# Patient Record
Sex: Female | Born: 1975 | Hispanic: Yes | Marital: Single | Smoking: Never smoker
Health system: Southern US, Community
[De-identification: ages and names within clinical notes are randomized; demographics above are authoritative.]

---

## 2003-05-23 ENCOUNTER — Encounter: Admission: RE | Admit: 2003-05-23 | Discharge: 2003-05-23 | Payer: Self-pay | Admitting: Specialist

## 2005-03-26 ENCOUNTER — Ambulatory Visit: Payer: Self-pay | Admitting: Obstetrics and Gynecology

## 2005-04-02 ENCOUNTER — Ambulatory Visit: Payer: Self-pay | Admitting: Obstetrics and Gynecology

## 2008-04-04 ENCOUNTER — Ambulatory Visit: Payer: Self-pay | Admitting: Ophthalmology

## 2010-12-23 ENCOUNTER — Ambulatory Visit: Payer: Self-pay | Admitting: Otolaryngology

## 2011-07-28 ENCOUNTER — Ambulatory Visit: Payer: Self-pay | Admitting: Internal Medicine

## 2014-10-18 ENCOUNTER — Ambulatory Visit (INDEPENDENT_AMBULATORY_CARE_PROVIDER_SITE_OTHER): Payer: BLUE CROSS/BLUE SHIELD

## 2014-10-18 ENCOUNTER — Ambulatory Visit (INDEPENDENT_AMBULATORY_CARE_PROVIDER_SITE_OTHER): Payer: BLUE CROSS/BLUE SHIELD | Admitting: Podiatry

## 2014-10-18 ENCOUNTER — Encounter: Payer: Self-pay | Admitting: Podiatry

## 2014-10-18 VITALS — BP 128/81 | HR 79 | Resp 16

## 2014-10-18 DIAGNOSIS — M722 Plantar fascial fibromatosis: Secondary | ICD-10-CM | POA: Diagnosis not present

## 2014-10-18 MED ORDER — METHYLPREDNISOLONE 4 MG PO TBPK: ORAL_TABLET | ORAL | Status: DC

## 2014-10-18 NOTE — Progress Notes (Signed)
   Subjective:    Patient ID: Jade GeorgeAbigail Guiterrez David, female    DOB: 1975/08/18, 39 y.o.   MRN: [id number]  HPI Comments: "I have pain in my foot"  Patient c/o aching plantar/lateral heel right for about 3 months. AM pain. She had this same problem in October 2015 and went to doctor and was given Naproxen and a compression ankle brace. Meds help some. She stands on feet all day and by end of day its very painful.  Patient presents with an interpreter.  Foot Pain      Review of Systems  All other systems reviewed and are negative.      Objective:   Physical Exam  Musculoskeletal:       Feet:   objective: I have reviewed her past mental history medications allergies surgery social history and review of systems. Pulses are strongly palpable bilateral. Neurologic sensorium is intact persons once the monofilament. Deep tendon reflexes are intact bilaterally and muscle strength is 5 over 5 dorsiflexion and plantar flexors and inverters and everters all intrinsic musculature is intact. Orthopedic evaluation demonstrates all joints distal to the ankle before range of motion without crepitation. She has pain on palpation the lateral calcaneal tubercle of the right heel.        Assessment & Plan:  Assessment: Plantar fasciitis right foot lateral tubercle.  Plan: Started her on a Medrol Dosepak to be followed by her naproxen. Injected her right lateral heel today placed her in a plantar fascial brace and the night splint. Discussed appropriate shoe gear stretching exercises ice therapy and sugar modifications.

## 2014-10-18 NOTE — Patient Instructions (Signed)

## 2014-11-15 ENCOUNTER — Ambulatory Visit (INDEPENDENT_AMBULATORY_CARE_PROVIDER_SITE_OTHER): Payer: BLUE CROSS/BLUE SHIELD | Admitting: Podiatry

## 2014-11-15 DIAGNOSIS — M722 Plantar fascial fibromatosis: Secondary | ICD-10-CM

## 2014-11-15 MED ORDER — MELOXICAM 15 MG PO TABS: 15.0000 mg | ORAL_TABLET | Freq: Every day | ORAL | Status: DC

## 2014-11-15 NOTE — Progress Notes (Signed)
Jade David presents with an interpreter today for follow-up of her plantar fasciitis to the lateral aspect of her right foot. She states that is approximately 60% improved. She continues to wear the day brace and the night splint. She currently is not taking any anti-inflammatory's having finished naproxen the urgent care physician provided her. She is currently wearing good tennis shoes and states that that has helped.  Objective: Vital signs are stable she is alert and oriented 3. Pulses are palpable. She has no pain on palpation medial calcaneal tubercle of the right heel. She only has pain on palpation to the lateral calcaneal tubercle of the right heel and it is pinpoint. No plantar tubercle pain. No erythema edema saline as drainage or odor. No pain on medial and lateral compression of the calcaneus.  Assessment: Lateral band plantar fasciitis right heel. 60% resolved.  Plan: Discussed etiology pathology conservative versus surgical therapies. At this point I am requesting that she continue a non-steroidal and a daily basis so we will prescribe meloxicam. I also injected her right heel today with Kenalog and local and aesthetic to the lateral aspect at the point of maximal tenderness. She will continue all other conservative therapies and I will follow-up with her in 4-6 weeks.

## 2014-12-20 ENCOUNTER — Encounter: Payer: Self-pay | Admitting: Podiatry

## 2014-12-20 ENCOUNTER — Ambulatory Visit (INDEPENDENT_AMBULATORY_CARE_PROVIDER_SITE_OTHER): Payer: Managed Care, Other (non HMO) | Admitting: Podiatry

## 2014-12-20 VITALS — BP 126/74 | HR 81 | Resp 16

## 2014-12-20 DIAGNOSIS — M722 Plantar fascial fibromatosis: Secondary | ICD-10-CM | POA: Diagnosis not present

## 2014-12-20 NOTE — Progress Notes (Signed)
She presents today for follow-up of her plantar fasciitis. She presents with her interpreter today however she is not utilized. She states that she is doing much better and has very little pain. She states that she notices when she does not wear the interfascial brace her pain is worse. She continues oral medication and sleeping with the night splint. She also continues the use of boot shoe gear.  Objective: Vital signs are stable she is alert and oriented 3. Pulses are strongly palpable. She has pain on palpation medial calcaneal tubercle to the right foot but much less in severity. She also has tenderness on palpation of the fifth metatarsal base right foot.  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  Plan: At this point we will continue all other conservative therapies and she will have a set of orthotics scanned today.

## 2014-12-27 ENCOUNTER — Other Ambulatory Visit: Payer: Self-pay | Admitting: Obstetrics and Gynecology

## 2014-12-27 DIAGNOSIS — Z1231 Encounter for screening mammogram for malignant neoplasm of breast: Secondary | ICD-10-CM

## 2015-01-01 ENCOUNTER — Ambulatory Visit
Admission: RE | Admit: 2015-01-01 | Discharge: 2015-01-01 | Disposition: A | Discharge status: Home / Self Care | Payer: Managed Care, Other (non HMO) | Source: Ambulatory Visit | Attending: Obstetrics and Gynecology | Admitting: Obstetrics and Gynecology

## 2015-01-01 DIAGNOSIS — Z1231 Encounter for screening mammogram for malignant neoplasm of breast: Secondary | ICD-10-CM | POA: Insufficient documentation

## 2015-01-17 ENCOUNTER — Ambulatory Visit (INDEPENDENT_AMBULATORY_CARE_PROVIDER_SITE_OTHER): Payer: Managed Care, Other (non HMO) | Admitting: Podiatry

## 2015-01-17 DIAGNOSIS — M722 Plantar fascial fibromatosis: Secondary | ICD-10-CM

## 2015-01-17 NOTE — Progress Notes (Signed)
She picked up her orthotics today. She was given both oral and written home-going instructions for the care and use of the orthotics. She will notify me in 1 month.

## 2015-01-17 NOTE — Patient Instructions (Signed)

## 2015-02-14 ENCOUNTER — Encounter: Payer: Self-pay | Admitting: Podiatry

## 2015-02-14 ENCOUNTER — Ambulatory Visit (INDEPENDENT_AMBULATORY_CARE_PROVIDER_SITE_OTHER): Payer: Managed Care, Other (non HMO) | Admitting: Podiatry

## 2015-02-14 DIAGNOSIS — M722 Plantar fascial fibromatosis: Secondary | ICD-10-CM

## 2015-02-14 MED ORDER — MELOXICAM 15 MG PO TABS: 15.0000 mg | ORAL_TABLET | Freq: Every day | ORAL | Status: DC

## 2015-02-14 NOTE — Progress Notes (Signed)
She presents today for follow-up of her orthotic she states that when she wears them 8 hours a make her feet hurt. She still has pain to the lateral aspect of the right heel.  Objective: Vital signs are stable she's alert and oriented 3. Pulses are strongly palpable. She has pain to the lateral aspect of the right heel secondary to compensatory plantar fasciitis.  Assessment: Fasciitis right heel. Lateral aspect.  Plan: Reinjected the lateral today with dexamethasone and local anesthesia. I also refilled her meloxicam. We discussed appropriate shoe gear stretching exercises and ice therapy. Follow up with her in 1 month.

## 2015-03-19 ENCOUNTER — Encounter: Payer: Self-pay | Admitting: Podiatry

## 2015-03-19 ENCOUNTER — Ambulatory Visit (INDEPENDENT_AMBULATORY_CARE_PROVIDER_SITE_OTHER): Payer: Managed Care, Other (non HMO) | Admitting: Podiatry

## 2015-03-19 DIAGNOSIS — M779 Enthesopathy, unspecified: Secondary | ICD-10-CM

## 2015-03-19 DIAGNOSIS — M722 Plantar fascial fibromatosis: Secondary | ICD-10-CM | POA: Diagnosis not present

## 2015-03-19 DIAGNOSIS — M7751 Other enthesopathy of right foot: Secondary | ICD-10-CM

## 2015-03-19 DIAGNOSIS — M778 Other enthesopathies, not elsewhere classified: Secondary | ICD-10-CM

## 2015-03-19 NOTE — Progress Notes (Signed)
She presents today with her interpreter for follow-up of her plantar fasciitis and lateral compensatory syndrome. She states that she has purchased new shoes which feel very good. There is a shoes feel that she states that she has had 2 bouts swelling that she noticed in the early morning while still in bed overlying the ankle and anterolateral ankle. She denies trauma.  Objective: Vital signs are stable she is alert and oriented 3. Pulses are palpable. She has pain on end range of motion of the subtalar joint right. She has pain on direct palpation medial calcaneal tubercle right. He has pain on palpation of the sinus tarsi right.  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  Plan: Injected the sinus tarsi today with 20 mg of Kenalog and local anesthetic which alleviated her symptoms immediately. I will follow-up with her in 1 month and consider surgical intervention or physical therapy at that time.

## 2015-04-16 ENCOUNTER — Encounter: Payer: Self-pay | Admitting: Podiatry

## 2015-04-16 ENCOUNTER — Ambulatory Visit (INDEPENDENT_AMBULATORY_CARE_PROVIDER_SITE_OTHER): Payer: Managed Care, Other (non HMO) | Admitting: Podiatry

## 2015-04-16 VITALS — BP 134/87 | HR 86 | Resp 12

## 2015-04-16 DIAGNOSIS — M722 Plantar fascial fibromatosis: Secondary | ICD-10-CM | POA: Diagnosis not present

## 2015-04-16 MED ORDER — METHYLPREDNISOLONE 4 MG PO TBPK: ORAL_TABLET | ORAL | Status: DC

## 2015-04-16 NOTE — Progress Notes (Signed)
She presents today with her interpreter for follow-up of plantar fasciitis right foot. She presents today with majority of the pain to the lateral aspect of her right foot. She states that last injection did not help at all.  Objective: Vital signs are stable she is alert and oriented 3. Pulses are strongly palpable. She has pain on palpation lateral aspect of the plantar fascial calcaneal insertion site right. Mild tenderness to the medial aspect.  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  Plan: Due to failure of all conservative therapies including steroids nonsteroidals injectables braces night splints and orthotics and shoe gear changes we are requesting an MRI of the right foot to rule out chronic intractable plantar fasciitis prior to surgical intervention. I will follow up with her once the report comes in.

## 2015-04-17 ENCOUNTER — Telehealth: Payer: Self-pay | Admitting: *Deleted

## 2015-04-17 DIAGNOSIS — M722 Plantar fascial fibromatosis: Secondary | ICD-10-CM

## 2015-04-17 NOTE — Telephone Encounter (Addendum)
Dr. Al CorpusHyatt ordered MRI right foot [phone] [id number], for plantar fasciitis with tear, surgical consideration.  05/07/2015 - Pt called asked what the next steps would be since her MRI was denied.  I told scheduler to schedule pt to be evaluated by Dr.Hyatt for up-dated data for possible MRI reorder.

## 2015-04-19 NOTE — Telephone Encounter (Signed)
Pre-certification was started with [employer] online. Additional clinical notes were requested. Will wait for processing and will proceed with scheduling if approved. 

## 2015-05-08 ENCOUNTER — Ambulatory Visit: Payer: Managed Care, Other (non HMO)

## 2015-05-09 ENCOUNTER — Telehealth: Payer: Self-pay | Admitting: *Deleted

## 2015-05-09 DIAGNOSIS — M722 Plantar fascial fibromatosis: Secondary | ICD-10-CM

## 2015-05-09 NOTE — Telephone Encounter (Addendum)
Appeal process started for CASE #[id number] FOR MRI RIGHT FOOT WITHOUT CONTRAST 73718, DX M72.2, REQUIRES PEER to PEER [phone] X4 X3 X2, USE CASE #.  05/10/2015 - DR. HYATT RECEIVED PRIOR AUTHORIZATION #[id number].  ORDERED MRI RIGHT FOOT WITHOUT CONTRAST, DX M72.2, [facility] WITH AUTHORIZATION #[id number].  Faxed orders to [phone] also.  06/05/2015 - DR. HYATT request [facility] overread.  I informed pt of the more indepth reading and the delay.  Faxed written request for copy of MRI disc right foot to [facility].

## 2015-05-23 ENCOUNTER — Ambulatory Visit: Payer: Managed Care, Other (non HMO) | Admitting: Podiatry

## 2015-06-01 ENCOUNTER — Ambulatory Visit
Admission: RE | Admit: 2015-06-01 | Discharge: 2015-06-01 | Disposition: A | Discharge status: Home / Self Care | Payer: Managed Care, Other (non HMO) | Source: Ambulatory Visit | Attending: Podiatry | Admitting: Podiatry

## 2015-06-01 DIAGNOSIS — M722 Plantar fascial fibromatosis: Secondary | ICD-10-CM

## 2015-06-05 NOTE — Telephone Encounter (Signed)
-----   Message from Elinor Parkinson, [place] sent at 06/02/2015 11:03 AM EST ----- Request an over read and inform patient of the delay please.  Thank you.

## 2015-06-11 ENCOUNTER — Telehealth: Payer: Self-pay | Admitting: *Deleted

## 2015-06-11 NOTE — Telephone Encounter (Signed)
Mailed MRI copy disc to [facility].

## 2015-07-02 ENCOUNTER — Encounter: Payer: Self-pay | Admitting: Podiatry

## 2015-07-16 ENCOUNTER — Encounter: Payer: Self-pay | Admitting: Podiatry

## 2015-07-16 ENCOUNTER — Ambulatory Visit (INDEPENDENT_AMBULATORY_CARE_PROVIDER_SITE_OTHER): Payer: Managed Care, Other (non HMO) | Admitting: Podiatry

## 2015-07-16 ENCOUNTER — Ambulatory Visit: Payer: Managed Care, Other (non HMO) | Admitting: Podiatry

## 2015-07-16 DIAGNOSIS — M722 Plantar fascial fibromatosis: Secondary | ICD-10-CM | POA: Diagnosis not present

## 2015-07-16 DIAGNOSIS — S86311D Strain of muscle(s) and tendon(s) of peroneal muscle group at lower leg level, right leg, subsequent encounter: Secondary | ICD-10-CM

## 2015-07-16 NOTE — Patient Instructions (Signed)
Pre-Operative Instructions  Congratulations, you have decided to take an important step to improving your quality of life.  You can be assured that the doctors of [facility] will be with you every step of the way.  1. Plan to be at the surgery center/hospital at least 1 (one) hour prior to your scheduled time unless otherwise directed by the surgical center/hospital staff.  You must have a responsible adult accompany you, remain during the surgery and drive you home.  Make sure you have directions to the surgical center/hospital and know how to get there on time. 2. For hospital based surgery you will need to obtain a history and physical form from your family physician within 1 month prior to the date of surgery- we will give you a form for you primary physician.  3. We make every effort to accommodate the date you request for surgery.  There are however, times where surgery dates or times have to be moved.  We will contact you as soon as possible if a change in schedule is required.   4. No Aspirin/Ibuprofen for one week before surgery.  If you are on aspirin, any non-steroidal anti-inflammatory medications (Mobic, Aleve, Ibuprofen) you should stop taking it 7 days prior to your surgery.  You make take Tylenol  For pain prior to surgery.  5. Medications- If you are taking daily heart and blood pressure medications, seizure, reflux, allergy, asthma, anxiety, pain or diabetes medications, make sure the surgery center/hospital is aware before the day of surgery so they may notify you which medications to take or avoid the day of surgery. 6. No food or drink after midnight the night before surgery unless directed otherwise by surgical center/hospital staff. 7. No alcoholic beverages 24 hours prior to surgery.  No smoking 24 hours prior to or 24 hours after surgery. 8. Wear loose pants or shorts- loose enough to fit over bandages, boots, and casts. 9. No slip on shoes, sneakers are best. 10. Bring  your boot with you to the surgery center/hospital.  Also bring crutches or a walker if your physician has prescribed it for you.  If you do not have this equipment, it will be provided for you after surgery. 11. If you have not been contracted by the surgery center/hospital by the day before your surgery, call to confirm the date and time of your surgery. 12. Leave-time from work may vary depending on the type of surgery you have.  Appropriate arrangements should be made prior to surgery with your employer. 13. Prescriptions will be provided immediately following surgery by your doctor.  Have these filled as soon as possible after surgery and take the medication as directed. 14. Remove nail polish on the operative foot. 15. Wash the night before surgery.  The night before surgery wash the foot and leg well with the antibacterial soap provided and water paying special attention to beneath the toenails and in between the toes.  Rinse thoroughly with water and dry well with a towel.  Perform this wash unless told not to do so by your physician.  Enclosed: 1 Ice pack (please put in freezer the night before surgery)   1 Hibiclens skin cleaner   Pre-op Instructions  If you have any questions regarding the instructions, do not hesitate to call our office.  [place]: [address] [place], [place] [address] [phone]  [place]: [address] [place], [place] [address] [phone]  [place]: [address]  [place], [place] [address] [phone]  Dr. Richard   Tuchman DPM, Dr. Norman Regal DPM Dr. Richard Sikora DPM, Dr. M. Todd Hyatt DPM, Dr. Kathryn Egerton DPM 

## 2015-07-16 NOTE — Progress Notes (Signed)
She presents today with her mother and interpreter for her MRI report regarding her right foot. She states that believe it or not it seems to be doing a little better as she points the lateral aspect of the foot. I asked about the plantar aspect and she states it is still the same as still continues to hurt. I asked her if she had any other new areas to become painful and she denied it. She denies any changes in her past medical history medications allergy surgeries or social history.  Objective: Vital signs are stable alert and oriented 3. Pulses are strongly palpable right lower extremity neurologic sensorium is intact. She has pain on palpation medially continued tubercle of her right heel as well as pain on eversion against resistance along the peroneal brevis tendon. The MRI report does state chronic intractable plantar fasciitis of the right heel as well as a 4 cm tear of the peroneal brevis tendon right.  Assessment: Peroneal tendon tear and plantar fasciitis right foot.  Plan: Discussed etiology pathology conservative versus surgical therapies. We consented her today for a primary repair of the peroneal brevis tendon. We also consented her for an endoscopic plantar fasciotomy right foot. We also consented her for possible cast depending on how invasive the peroneal repair will have to be. I answered all the questions regarding these procedures to the best of my ability in layman's terms she understood this was amenable to it and signed all 3 patient the consent form. We did discuss the possible postop complications which may include but are not limited to postop pain bleeding swelling infection recurrence need for further surgery over correction under correction recurrence loss of digit loss of limb loss of life. She understands that she will possibly be utilizing crutches.

## 2015-07-25 ENCOUNTER — Telehealth: Payer: Self-pay | Admitting: *Deleted

## 2015-07-25 NOTE — Telephone Encounter (Signed)
I'm calling to see if you would like to schedule your surgery with Dr. Al CorpusHyatt.  "Yes, I would like to schedule it."  He can do it on April 7 or 21.  "Let's do it as soon as possible.  Let's do it on April 7."  You can go ahead and register now.  The surgical center will call you a day or two prior to surgery date with the arrival time.  "Okay, thank you."

## 2015-08-08 DIAGNOSIS — M79673 Pain in unspecified foot: Secondary | ICD-10-CM

## 2015-08-16 ENCOUNTER — Other Ambulatory Visit: Payer: Self-pay | Admitting: Podiatry

## 2015-08-16 MED ORDER — OXYCODONE-ACETAMINOPHEN 10-325 MG PO TABS: ORAL_TABLET | ORAL | Status: DC

## 2015-08-16 MED ORDER — CLINDAMYCIN HCL 150 MG PO CAPS: 150.0000 mg | ORAL_CAPSULE | Freq: Three times a day (TID) | ORAL | Status: DC

## 2015-08-16 MED ORDER — ONDANSETRON HCL 4 MG PO TABS: 4.0000 mg | ORAL_TABLET | Freq: Three times a day (TID) | ORAL | Status: DC | PRN

## 2015-08-17 ENCOUNTER — Encounter: Payer: Self-pay | Admitting: Podiatry

## 2015-08-17 DIAGNOSIS — S86311D Strain of muscle(s) and tendon(s) of peroneal muscle group at lower leg level, right leg, subsequent encounter: Secondary | ICD-10-CM | POA: Diagnosis not present

## 2015-08-17 DIAGNOSIS — M722 Plantar fascial fibromatosis: Secondary | ICD-10-CM | POA: Diagnosis not present

## 2015-08-23 ENCOUNTER — Telehealth: Payer: Self-pay | Admitting: Podiatry

## 2015-08-23 ENCOUNTER — Ambulatory Visit (INDEPENDENT_AMBULATORY_CARE_PROVIDER_SITE_OTHER): Payer: Managed Care, Other (non HMO)

## 2015-08-23 ENCOUNTER — Ambulatory Visit (INDEPENDENT_AMBULATORY_CARE_PROVIDER_SITE_OTHER): Payer: Managed Care, Other (non HMO) | Admitting: Podiatry

## 2015-08-23 ENCOUNTER — Encounter: Payer: Self-pay | Admitting: Podiatry

## 2015-08-23 VITALS — BP 126/79 | HR 85 | Resp 18

## 2015-08-23 DIAGNOSIS — S86311D Strain of muscle(s) and tendon(s) of peroneal muscle group at lower leg level, right leg, subsequent encounter: Secondary | ICD-10-CM

## 2015-08-23 DIAGNOSIS — Z9889 Other specified postprocedural states: Secondary | ICD-10-CM

## 2015-08-23 DIAGNOSIS — M722 Plantar fascial fibromatosis: Secondary | ICD-10-CM

## 2015-08-23 NOTE — Patient Instructions (Signed)
Venous Thromboembolism Prevention Venous thromboembolism (VTE) is a condition in which a blood clot (thrombus) develops in the body. A thrombus usually occurs in a deep vein in the leg or the pelvis (DVT), but it can also occur in the arm. Sometimes, pieces of a thrombus can break off from its original place of development and travel through the bloodstream to other parts of the body. When that happens, the thrombus is called an embolus. An embolus that travels to one or both lungs is called a pulmonary embolism. An embolism can block the blood flow in the blood vessels of other organs as well. VTE is a serious health condition that can cause disability or death. It is very important to get help right away and to not ignore symptoms. HOW CAN A VTE BE PREVENTED?  Exercise regularly. Take a brisk 30 minute walk every day. Staying active and moving around can help you to prevent blood clots.  Avoid sitting or lying in bed for long periods of time without moving your legs. Change your position often, especially during long-distance travel (over 4 hours).  If you are a woman who is over 35 years of age, avoid unnecessary use of medicines that contain estrogen. These include birth control pills and hormone replacement therapy.  Do not smoke, especially if you take estrogen medicines. If you need help quitting, ask your health care provider.  Eat plenty of fruits and vegetables. Ask your health care provider or dietitian if there are foods that you should avoid.  Maintain a weight that is appropriate for your height. Ask your health care provider what weight is healthy for you.  Wear loose-fitting clothing. Avoid constrictive or tight clothing around your legs or waist.  Try not to bump or injure your legs. Avoid crossing your legs when you are sitting.  Do not use pillows under your knees while lying down unless told by your health care provider.  Wear support hose (compression stockings or TED  hose) as told by your health care provider Compression stockings increase blood flow in your legs and can help prevent blood clots. Do not let them bunch up when you are wearing them. HOW CAN I PREVENT VTE WHEN I TRAVEL? Long-distance travel (over 4 hours) can increase the risk of a VTE. To prevent VTE when traveling:  Exercise your legs every hour by standing, stretching, and bending and straightening your legs. If you are traveling by airplane, train, or bus, walk up and down the aisle as often as possible to get your blood moving. If you are traveling by car, stop and get out of the car every hour to exercise your legs and stretch. Other types of exercise might include:  Keeping your feet flat on the ground and raising your toes.  Switching from tightening the muscles in your calves and thighs to relaxing those same muscles while you are sitting.  Pointing and flexing your feet at the ankle joints while you are sitting.  Stay well hydrated while traveling. Drink enough water to keep your urine clear or pale yellow.  Avoid drinking alcohol during long travel. Generally, it is not recommended that you take medicines to prevent DVT during routine travel. HOW CAN VTE BE PREVENTED IF I AM HOSPITALIZED? A VTE may be prevented by taking medicines that are prescribed to prevent blood clots (anticoagulants). You can also help to prevent VTE while in the hospital by taking these actions:  Get out of bed and walk. Ask your health care provider   if this is safe for you to do.  Request the use of a sequential compression device (SCD). This is a machine that pumps air into compression sleeves that are wrapped around your legs.  Request the use of compression stockings, which are tight, elastic stockings that apply pressure to the lower legs. Compression stockings are sometimes used with SCDs. HOW CAN I PREVENT VTE AFTER SURGERY? Understand that there is an increased risk for VTE for the first 4-6 weeks  after surgery. During this time:  Avoid long-distance travel (over 4 hours). If you must travel during this time, ask your health care provider about additional preventive actions that you can take. These might include exercising your arms and legs every hour while you travel.  Avoid sitting or lying still for too long. If possible, get up and walk around one time every hour. Ask your health care provider when this is safe for you to do. SEEK IMMEDIATE MEDICAL CARE IF:  You have new or increased pain, swelling, or redness in an arm or leg.  You have numbness or tingling in an arm or leg.  You have shortness of breath while active or at rest.  You have chest pain.  You have a rapid or irregular heartbeat.  You feel light-headed or dizzy.  You cough up blood.  You notice blood in your vomit, bowel movement, or urine. These symptoms may represent a serious problem that is an emergency. Do not wait to see if the symptoms will go away. Get medical help right away. Call your local emergency services (911 in the [place] Do not drive yourself to the hospital.   This information is not intended to replace advice given to you by your health care provider. Make sure you discuss any questions you have with your health care provider.   Document Released: 04/16/2009 Document Revised: 01/17/2015 Document Reviewed: 08/23/2014 Elsevier Interactive Patient Education 2016 Elsevier Inc. Cast or Splint Care Casts and splints support injured limbs and keep bones from moving while they heal. It is important to care for your cast or splint at home.  HOME CARE INSTRUCTIONS  Keep the cast or splint uncovered during the drying period. It can take 24 to 48 hours to dry if it is made of plaster. A fiberglass cast will dry in less than 1 hour.  Do not rest the cast on anything harder than a pillow for the first 24 hours.  Do not put weight on your injured limb or apply pressure to the cast until your health  care provider gives you permission.  Keep the cast or splint dry. Wet casts or splints can lose their shape and may not support the limb as well. A wet cast that has lost its shape can also create harmful pressure on your skin when it dries. Also, wet skin can become infected.  Cover the cast or splint with a plastic bag when bathing or when out in the rain or snow. If the cast is on the trunk of the body, take sponge baths until the cast is removed.  If your cast does become wet, dry it with a towel or a blow dryer on the cool setting only.  Keep your cast or splint clean. Soiled casts may be wiped with a moistened cloth.  Do not place any hard or soft foreign objects under your cast or splint, such as cotton, toilet paper, lotion, or powder.  Do not try to scratch the skin under the cast with any object.   The object could get stuck inside the cast. Also, scratching could lead to an infection. If itching is a problem, use a blow dryer on a cool setting to relieve discomfort.  Do not trim or cut your cast or remove padding from inside of it.  Exercise all joints next to the injury that are not immobilized by the cast or splint. For example, if you have a long leg cast, exercise the hip joint and toes. If you have an arm cast or splint, exercise the shoulder, elbow, thumb, and fingers.  Elevate your injured arm or leg on 1 or 2 pillows for the first 1 to 3 days to decrease swelling and pain.It is best if you can comfortably elevate your cast so it is higher than your heart. SEEK MEDICAL CARE IF:   Your cast or splint cracks.  Your cast or splint is too tight or too loose.  You have unbearable itching inside the cast.  Your cast becomes wet or develops a soft spot or area.  You have a bad smell coming from inside your cast.  You get an object stuck under your cast.  Your skin around the cast becomes red or raw.  You have new pain or worsening pain after the cast has been  applied. SEEK IMMEDIATE MEDICAL CARE IF:   You have fluid leaking through the cast.  You are unable to move your fingers or toes.  You have discolored (blue or white), cool, painful, or very swollen fingers or toes beyond the cast.  You have tingling or numbness around the injured area.  You have severe pain or pressure under the cast.  You have any difficulty with your breathing or have shortness of breath.  You have chest pain.   This information is not intended to replace advice given to you by your health care provider. Make sure you discuss any questions you have with your health care provider.   Document Released: 04/25/2000 Document Revised: 02/16/2013 Document Reviewed: 11/04/2012 Elsevier Interactive Patient Education 2016 Elsevier Inc.  

## 2015-08-23 NOTE — Telephone Encounter (Signed)
Patient saw Dr. Ardelle AntonWagoner today for her first PO visit, she wanted to know the status of her FMLA/Disability paperwork that she dropped of on March 29. It was faxed to you that day. She has already left the office but you can call her to advise. Thank you!

## 2015-08-23 NOTE — Telephone Encounter (Signed)
Attempted to call patient to advise her that all paperwork was completed and faxed over on 08/09/15, her estimated return to work date is 10/15/15. Phone rang, then went to busy signal

## 2015-08-26 DIAGNOSIS — M722 Plantar fascial fibromatosis: Secondary | ICD-10-CM | POA: Insufficient documentation

## 2015-08-26 DIAGNOSIS — S86319A Strain of muscle(s) and tendon(s) of peroneal muscle group at lower leg level, unspecified leg, initial encounter: Secondary | ICD-10-CM | POA: Insufficient documentation

## 2015-08-26 DIAGNOSIS — Z9889 Other specified postprocedural states: Secondary | ICD-10-CM | POA: Insufficient documentation

## 2015-08-26 NOTE — Progress Notes (Signed)
Patient ID: Jade David, female   DOB: 06-12-1975, 40 y.o.   MRN: [id number]  Subjective: Jade David is a 40 y.o. is seen today in office s/p right peroneal tendon repair and EPF preformed by Dr. Al CorpusHyatt. She states that she has not been having much pain is limiting ibuprofen as needed. She has remained nonweightbearing. She filled the cast is fitting well any complications. She is also been nonweightbearing as much as possible. Denies any systemic compalints such as fevers, chills, nausea, vomiting. No calf pain, chest pain, shortness of breath.   Objective: General: No acute distress, AAOx3  Cast is clean, dry, intact to the right foot. It appears to be fitting well there is no rubbing subjectively. Motor function intact to all the digits. Immediate capillary refill time to all the toes.  There is no pain with calf compression, swelling, warmth, erythema.  Assessment and Plan:  Status post left peroneal tendon repair and EPF, doing well with no complications; POV #1  -Treatment options discussed including all alternatives, risks, and complications -X-rays were obtained and reviewed with the patient. No evidence of acute fracture or stress fracture.  -At this time the cast is fitting well. We will remove this next week as well as possible suture/staple removal.  -Ice/elevation -Pain medication as needed. -Monitor for any clinical signs or symptoms of infection and DVT/PE and directed to call the office immediately should any occur or go to the ER. -Follow-up in 1 week  or sooner if any problems arise. In the meantime, encouraged to call the office with any questions, concerns, change in symptoms.   Ovid CurdMatthew Wagoner, DPM

## 2015-08-29 ENCOUNTER — Encounter: Payer: Self-pay | Admitting: Podiatry

## 2015-08-29 ENCOUNTER — Ambulatory Visit (INDEPENDENT_AMBULATORY_CARE_PROVIDER_SITE_OTHER): Payer: Managed Care, Other (non HMO) | Admitting: Podiatry

## 2015-08-29 DIAGNOSIS — S86311D Strain of muscle(s) and tendon(s) of peroneal muscle group at lower leg level, right leg, subsequent encounter: Secondary | ICD-10-CM | POA: Diagnosis not present

## 2015-08-29 DIAGNOSIS — M722 Plantar fascial fibromatosis: Secondary | ICD-10-CM

## 2015-08-29 DIAGNOSIS — Z9889 Other specified postprocedural states: Secondary | ICD-10-CM

## 2015-08-29 NOTE — Progress Notes (Signed)
She presents today for a cast change right lower extremity. Date of surgery 08/17/2015 peroneal tendon repair and a endoscopic plantar fasciotomy right foot. She denies fever chills nausea vomiting muscle aches and pains shortness of breath or chest pain.  Objective: Vital signs are stable alert and oriented 3. Presents ambulating with crutches nonweightbearing fashion right lower extremity. Once the cast was removed and the dry sterile dressing was removed demonstrates no erythema cellulitis drainage or odor. Staples are intact margins are well coapted no signs of infection. She has good range of motion of the toes without symptoms good range of motion of the foot without pain.  Assessment: Well healing surgical foot right.  Plan: Redressed the foot today with a dry sterile compressive dressing and reapplied a below-knee cast. Follow up with her in 2 weeks

## 2015-08-30 NOTE — Progress Notes (Signed)
DOS 08/17/2015 Endoscopic plantar fasciotomy right foot, peroneal tendon repair right foot, possible cast right foot and leg.

## 2015-09-12 ENCOUNTER — Encounter: Payer: Self-pay | Admitting: Podiatry

## 2015-09-12 ENCOUNTER — Ambulatory Visit (INDEPENDENT_AMBULATORY_CARE_PROVIDER_SITE_OTHER): Payer: Managed Care, Other (non HMO) | Admitting: Podiatry

## 2015-09-12 DIAGNOSIS — M722 Plantar fascial fibromatosis: Secondary | ICD-10-CM

## 2015-09-12 DIAGNOSIS — Z9889 Other specified postprocedural states: Secondary | ICD-10-CM | POA: Diagnosis not present

## 2015-09-12 DIAGNOSIS — S86311D Strain of muscle(s) and tendon(s) of peroneal muscle group at lower leg level, right leg, subsequent encounter: Secondary | ICD-10-CM | POA: Diagnosis not present

## 2015-09-12 NOTE — Progress Notes (Signed)
She presents today nearly 1 month status post endoscopic fasciotomy right foot and peroneal tendon repair right foot. She presents in her cast today and states that she is eager to get out of her cast. She denies chest pain Pain shortness of breath.  Objective: Vital signs are stable she is alert and oriented 3. Cast to the right lower extremity was removed. Pulses are palpable. No erythema cellulitis drainage or odor. She good range of motion of the ankle nontender. No edema no cellulitis drainage or odor. Margins remain well coapted. No signs of infection.  Assessment: Well-healing surgical foot right.  Plan: Placed her in a compression anklet which may be removed daily. Also placed her in a Cam Walker which she will remain nonweightbearing in for the next week. After 1 week she will start with very light partial weightbearing and progress to full weightbearing in 2 weeks. I will follow-up with her in 2 weeks. At that time I plan to place her in a brace in a regular shoe.

## 2015-10-01 ENCOUNTER — Encounter: Payer: Self-pay | Admitting: Podiatry

## 2015-10-01 ENCOUNTER — Ambulatory Visit (INDEPENDENT_AMBULATORY_CARE_PROVIDER_SITE_OTHER): Payer: Managed Care, Other (non HMO) | Admitting: Podiatry

## 2015-10-01 VITALS — BP 115/71 | HR 75 | Resp 16

## 2015-10-01 DIAGNOSIS — Z9889 Other specified postprocedural states: Secondary | ICD-10-CM

## 2015-10-01 DIAGNOSIS — S86311D Strain of muscle(s) and tendon(s) of peroneal muscle group at lower leg level, right leg, subsequent encounter: Secondary | ICD-10-CM

## 2015-10-01 DIAGNOSIS — M722 Plantar fascial fibromatosis: Secondary | ICD-10-CM

## 2015-10-02 NOTE — Progress Notes (Signed)
She presents today for follow-up of her right foot surgery date of surgery 08/17/2015. States that she is doing much better.  Objective: Vital signs are stable she is alert and oriented 3. Peroneal tendons appear to be healing very nicely she has full abduction against resistance without complications or pain. She has no pain on palpation medial calcaneal tubercle of the right heel. No erythema cellulitis drainage or odor foot appears to be healing well.  Assessment: 1 neurosurgical foot right.  Plan: We'll place her in a Tri-Lock brace encouraged her to wear this at all times while ambulating. She is allowed to get back into her regular shoe gear and I will follow-up with her in 1 month.

## 2015-10-22 ENCOUNTER — Encounter: Payer: Self-pay | Admitting: Podiatry

## 2015-10-22 ENCOUNTER — Ambulatory Visit (INDEPENDENT_AMBULATORY_CARE_PROVIDER_SITE_OTHER): Payer: Managed Care, Other (non HMO) | Admitting: Podiatry

## 2015-10-22 VITALS — BP 128/68 | HR 80 | Resp 16

## 2015-10-22 DIAGNOSIS — Z9889 Other specified postprocedural states: Secondary | ICD-10-CM

## 2015-10-22 DIAGNOSIS — S86311D Strain of muscle(s) and tendon(s) of peroneal muscle group at lower leg level, right leg, subsequent encounter: Secondary | ICD-10-CM

## 2015-10-22 DIAGNOSIS — M722 Plantar fascial fibromatosis: Secondary | ICD-10-CM

## 2015-10-22 NOTE — Progress Notes (Signed)
She presents today for follow-up of her peroneal tendon repair right and her endoscopic plantar fasciotomy right. Date of surgery was 08/17/2015. She states that she still has some tenderness along the distal most aspect of the peroneal incision site.  Objective: Vital signs are stable she's alert and oriented 3 minimal erythema no edema saline as drainage or odor. She has tenderness on outpatient in the distalmost aspect of the incision site along the lateral aspect of the ankle.  Assessment: Well-healing peroneal tendon repair and endoscopic plantar fasciotomy.  Plan: I encouraged her to discontinue use of the crutches as well as the Tri-Lock brace. Follow up with her in 1 month

## 2015-11-05 ENCOUNTER — Telehealth: Payer: Self-pay | Admitting: *Deleted

## 2015-11-05 NOTE — Telephone Encounter (Addendum)
Pt states Dr. Al CorpusHyatt took her out of the braces and crutch over a week ago, now for 1 week she has had swelling and can't get her foot in a sandal after work. Pt states she is able to wear an athletic shoe while at work, but swells after work.  I told pt to ice and elevate the surgical foot immediately after work, then  go back in to the boot or brace after work until able to wear a regular casual shoe all day.  I told pt I would call with any change of instructions.  Left message informing pt of Dr. Geryl RankinsHyatt's recommendation.

## 2015-11-05 NOTE — Telephone Encounter (Signed)
Her only other option at this point would be to go to PT.  I don't think that she will want to do that because of the cost and we tried it preop.  If she does want to go then evaluate and treat s/p epf and peroneal tendon tear repair

## 2015-11-28 ENCOUNTER — Encounter: Payer: Self-pay | Admitting: Podiatry

## 2015-11-28 ENCOUNTER — Ambulatory Visit (INDEPENDENT_AMBULATORY_CARE_PROVIDER_SITE_OTHER): Payer: Managed Care, Other (non HMO)

## 2015-11-28 ENCOUNTER — Ambulatory Visit (INDEPENDENT_AMBULATORY_CARE_PROVIDER_SITE_OTHER): Payer: Managed Care, Other (non HMO) | Admitting: Podiatry

## 2015-11-28 DIAGNOSIS — M722 Plantar fascial fibromatosis: Secondary | ICD-10-CM

## 2015-11-28 DIAGNOSIS — S86311D Strain of muscle(s) and tendon(s) of peroneal muscle group at lower leg level, right leg, subsequent encounter: Secondary | ICD-10-CM | POA: Diagnosis not present

## 2015-11-28 DIAGNOSIS — Z9889 Other specified postprocedural states: Secondary | ICD-10-CM

## 2015-11-28 DIAGNOSIS — M25471 Effusion, right ankle: Secondary | ICD-10-CM

## 2015-11-28 MED ORDER — METHYLPREDNISOLONE 4 MG PO TBPK: ORAL_TABLET | ORAL | Status: DC

## 2015-11-28 NOTE — Progress Notes (Signed)
She presents today for follow-up of her peroneal tendon repair and an endoscopic plantar fasciotomy right foot. Date of surgery was 08/17/2015. She states that she has some extreme swelling over the past couple weeks and has been exquisitely tender.  Objective: Vital signs are stable she is alert and oriented 3 mild edema to the right foot pitting in nature on the leg she did show me a picture of when it was excessively swollen with venous engorgement along the medial ankle. She has no pain on palpation medially located tubercle of the surgical site of the peroneal tendon repair.  Assessment: Swelling and edema to the right lower extremity.  Plan: Placed her on a Medrol Dosepak and will follow up with her in 1 month if she is not improved we will consider venous Doppler for insufficiency.

## 2015-12-26 ENCOUNTER — Encounter: Payer: Self-pay | Admitting: Podiatry

## 2015-12-26 ENCOUNTER — Ambulatory Visit (INDEPENDENT_AMBULATORY_CARE_PROVIDER_SITE_OTHER): Payer: Managed Care, Other (non HMO) | Admitting: Podiatry

## 2015-12-26 DIAGNOSIS — M722 Plantar fascial fibromatosis: Secondary | ICD-10-CM

## 2015-12-26 DIAGNOSIS — M7751 Other enthesopathy of right foot: Secondary | ICD-10-CM | POA: Diagnosis not present

## 2015-12-26 DIAGNOSIS — S86311D Strain of muscle(s) and tendon(s) of peroneal muscle group at lower leg level, right leg, subsequent encounter: Secondary | ICD-10-CM

## 2015-12-26 DIAGNOSIS — Z9889 Other specified postprocedural states: Secondary | ICD-10-CM | POA: Diagnosis not present

## 2015-12-26 DIAGNOSIS — M779 Enthesopathy, unspecified: Principal | ICD-10-CM

## 2015-12-26 DIAGNOSIS — M778 Other enthesopathies, not elsewhere classified: Secondary | ICD-10-CM

## 2015-12-26 NOTE — Progress Notes (Signed)
She presents today for follow-up of her plantar fasciotomy and peroneal tendinitis right foot. She states that she's having some residual pain to the lateral aspect of the foot and she points to the fourth fifth met cuboid articulation area. She states the majority of the pain comes at nighttime.  Objective: Vital signs are stable she's alert and oriented 3. She has pain on palpation and range of motion of the fourth fifth met cuboid articulation. No reproducible pain on palpation of the EPF site or the peroneal tendon repair site.  Assessment: Capsulitis fourth fifth met cuboid articulation possibly associated with compensation.  Plan: I injected today with Kenalog and local anesthetic 2. Lexxel tenderness dorsal lateral aspect of the right foot. Encouraged her to wear her orthotics which she has not been doing. I will follow-up with her in 6 weeks if necessary.

## 2016-01-30 ENCOUNTER — Ambulatory Visit (INDEPENDENT_AMBULATORY_CARE_PROVIDER_SITE_OTHER): Payer: Managed Care, Other (non HMO) | Admitting: Podiatry

## 2016-01-30 ENCOUNTER — Encounter: Payer: Self-pay | Admitting: Podiatry

## 2016-01-30 DIAGNOSIS — M778 Other enthesopathies, not elsewhere classified: Secondary | ICD-10-CM

## 2016-01-30 DIAGNOSIS — M7751 Other enthesopathy of right foot: Secondary | ICD-10-CM | POA: Diagnosis not present

## 2016-01-30 DIAGNOSIS — M779 Enthesopathy, unspecified: Principal | ICD-10-CM

## 2016-01-30 NOTE — Progress Notes (Signed)
She presents today with a follow-up visit for pain to the dorsal lateral aspect of her right foot. She is status post peroneal tendon repair and an endoscopic plantar fasciotomy. I was under the impression that she had been wearing her orthotics after the EPF however the miscommunication due to the language barrier I now understand that she was wearing her ankle brace. She has not been wearing her orthotic devices.  Objective: Vital signs are stable she is alert and oriented 3. Pulses are palpable. She has pain on palpation to the fourth and fifth metatarsocuboid articulation as well as the calcaneocuboid articulation. His is more than likely a result of endoscopic plantar fasciotomy and lateral cuboid impingement syndrome.  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  Plan: At this point I highly recommend that she get back into her regular shoe gear with her orthotics. I will follow-up with her in 1 month she will notify us more immediately if necessary.

## 2016-03-10 ENCOUNTER — Encounter: Payer: Self-pay | Admitting: Podiatry

## 2016-03-10 ENCOUNTER — Ambulatory Visit (INDEPENDENT_AMBULATORY_CARE_PROVIDER_SITE_OTHER): Payer: Managed Care, Other (non HMO) | Admitting: Podiatry

## 2016-03-10 DIAGNOSIS — M7751 Other enthesopathy of right foot: Secondary | ICD-10-CM | POA: Diagnosis not present

## 2016-03-10 DIAGNOSIS — M779 Enthesopathy, unspecified: Principal | ICD-10-CM

## 2016-03-10 DIAGNOSIS — M778 Other enthesopathies, not elsewhere classified: Secondary | ICD-10-CM

## 2016-03-10 NOTE — Progress Notes (Signed)
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 She states that since I started wearing the orthotics again I have been doing very well. I've had absolutely no pain. She does state that she had pain in her right buttocks that radiated down her leg and was seen emergently for sciatica. She was provided narcotics and anti-inflammatories including an injection which alleviated her symptoms. She states that currently she is 100% pain-free.  Objective: Vital signs are stable alert and oriented 3. Pulses are palpable. No reproducible pain on palpation medial calcaneal tubercle or the anterolateral aspect of the right foot.  Assessment: Resolving capsulitis right.  Plan: Follow up with us on an as-needed basis.

## 2018-03-08 ENCOUNTER — Other Ambulatory Visit: Payer: Self-pay | Admitting: Obstetrics and Gynecology

## 2018-03-08 DIAGNOSIS — Z1231 Encounter for screening mammogram for malignant neoplasm of breast: Secondary | ICD-10-CM

## 2018-03-31 ENCOUNTER — Ambulatory Visit
Admission: RE | Admit: 2018-03-31 | Discharge: 2018-03-31 | Disposition: A | Discharge status: Home / Self Care | Payer: 59 | Source: Ambulatory Visit | Attending: Obstetrics and Gynecology | Admitting: Obstetrics and Gynecology

## 2018-03-31 DIAGNOSIS — Z1231 Encounter for screening mammogram for malignant neoplasm of breast: Secondary | ICD-10-CM | POA: Diagnosis not present

## 2019-03-16 ENCOUNTER — Other Ambulatory Visit: Payer: Self-pay | Admitting: Obstetrics and Gynecology

## 2019-03-16 DIAGNOSIS — Z1231 Encounter for screening mammogram for malignant neoplasm of breast: Secondary | ICD-10-CM

## 2019-04-05 ENCOUNTER — Ambulatory Visit
Admission: RE | Admit: 2019-04-05 | Discharge: 2019-04-05 | Disposition: A | Discharge status: Home / Self Care | Payer: 59 | Source: Ambulatory Visit | Attending: Obstetrics and Gynecology | Admitting: Obstetrics and Gynecology

## 2019-04-05 DIAGNOSIS — Z1231 Encounter for screening mammogram for malignant neoplasm of breast: Secondary | ICD-10-CM | POA: Diagnosis present

## 2020-04-11 ENCOUNTER — Other Ambulatory Visit: Payer: Self-pay | Admitting: Obstetrics and Gynecology

## 2020-04-11 DIAGNOSIS — Z1231 Encounter for screening mammogram for malignant neoplasm of breast: Secondary | ICD-10-CM

## 2020-04-18 ENCOUNTER — Other Ambulatory Visit: Payer: Self-pay

## 2020-04-18 ENCOUNTER — Ambulatory Visit
Admission: RE | Admit: 2020-04-18 | Discharge: 2020-04-18 | Disposition: A | Discharge status: Home / Self Care | Payer: 59 | Source: Ambulatory Visit | Attending: Obstetrics and Gynecology | Admitting: Obstetrics and Gynecology

## 2020-04-18 DIAGNOSIS — Z1231 Encounter for screening mammogram for malignant neoplasm of breast: Secondary | ICD-10-CM | POA: Insufficient documentation

## 2020-04-26 ENCOUNTER — Other Ambulatory Visit: Payer: Self-pay | Admitting: Obstetrics and Gynecology

## 2020-04-26 DIAGNOSIS — N6489 Other specified disorders of breast: Secondary | ICD-10-CM

## 2020-04-26 DIAGNOSIS — R928 Other abnormal and inconclusive findings on diagnostic imaging of breast: Secondary | ICD-10-CM

## 2020-04-26 DIAGNOSIS — N6320 Unspecified lump in the left breast, unspecified quadrant: Secondary | ICD-10-CM

## 2020-05-01 ENCOUNTER — Ambulatory Visit
Admission: RE | Admit: 2020-05-01 | Discharge: 2020-05-01 | Disposition: A | Discharge status: Home / Self Care | Payer: 59 | Source: Ambulatory Visit | Attending: Obstetrics and Gynecology | Admitting: Obstetrics and Gynecology

## 2020-05-01 ENCOUNTER — Other Ambulatory Visit: Payer: Self-pay

## 2020-05-01 DIAGNOSIS — N6320 Unspecified lump in the left breast, unspecified quadrant: Secondary | ICD-10-CM | POA: Diagnosis present

## 2020-05-01 DIAGNOSIS — N6489 Other specified disorders of breast: Secondary | ICD-10-CM

## 2020-05-01 DIAGNOSIS — R928 Other abnormal and inconclusive findings on diagnostic imaging of breast: Secondary | ICD-10-CM | POA: Diagnosis present

## 2022-05-29 ENCOUNTER — Ambulatory Visit: Payer: Managed Care, Other (non HMO) | Admitting: Podiatry

## 2022-06-04 ENCOUNTER — Ambulatory Visit (INDEPENDENT_AMBULATORY_CARE_PROVIDER_SITE_OTHER): Payer: BC Managed Care – PPO | Admitting: Podiatry

## 2022-06-04 ENCOUNTER — Encounter: Payer: Self-pay | Admitting: *Deleted

## 2022-06-04 ENCOUNTER — Other Ambulatory Visit: Payer: Self-pay | Admitting: Podiatry

## 2022-06-04 ENCOUNTER — Encounter: Payer: Self-pay | Admitting: Podiatry

## 2022-06-04 ENCOUNTER — Ambulatory Visit (INDEPENDENT_AMBULATORY_CARE_PROVIDER_SITE_OTHER): Payer: BC Managed Care – PPO

## 2022-06-04 VITALS — BP 134/74 | HR 79

## 2022-06-04 DIAGNOSIS — M722 Plantar fascial fibromatosis: Secondary | ICD-10-CM

## 2022-06-04 DIAGNOSIS — M7661 Achilles tendinitis, right leg: Secondary | ICD-10-CM | POA: Diagnosis not present

## 2022-06-04 MED ORDER — DEXAMETHASONE SODIUM PHOSPHATE 120 MG/30ML IJ SOLN
2.0000 mg | Freq: Once | INTRAMUSCULAR | Status: AC
  Administered 2022-06-04: 2 mg via INTRA_ARTICULAR

## 2022-06-04 MED ORDER — METHYLPREDNISOLONE 4 MG PO TBPK: ORAL_TABLET | ORAL | 0 refills | Status: DC

## 2022-06-04 MED ORDER — MELOXICAM 15 MG PO TABS: 15.0000 mg | ORAL_TABLET | Freq: Every day | ORAL | 3 refills | Status: AC

## 2022-06-04 NOTE — Progress Notes (Signed)
  Subjective:  Patient ID: Jade David, female    DOB: 12-11-1975,  MRN: [id number] HPI Chief Complaint  Patient presents with   Foot Pain    Posterior heel right - aching, swelling x 1 month, no injury, previous EPF right, tried diclofenac and arnicare gel-no help   New Patient (Initial Visit)    Est pt 2017    47 y.o. female presents with the above complaint.   ROS: Denies fever chills nausea vomit muscle aches pains calf pain back pain chest pain shortness of breath  No past medical history on file. No past surgical history on file.  Current Outpatient Medications:    meloxicam (MOBIC) 15 MG tablet, Take 1 tablet (15 mg total) by mouth daily., Disp: 30 tablet, Rfl: 3   methylPREDNISolone (MEDROL DOSEPAK) 4 MG TBPK tablet, 6 day dose pack - take as directed, Disp: 21 tablet, Rfl: 0  Current Facility-Administered Medications:    dexamethasone (DECADRON) injection 2 mg, 2 mg, Intra-articular, Once, Annel Zunker T, DPM  Allergies  Allergen Reactions   Latex    Penicillins    Review of Systems Objective:   Vitals:   06/04/22 0834  BP: 134/74  Pulse: 79    General: Well developed, nourished, in no acute distress, alert and oriented x3   Dermatological: Skin is warm, dry and supple bilateral. Nails x 10 are well maintained; remaining integument appears unremarkable at this time. There are no open sores, no preulcerative lesions, no rash or signs of infection present.  Vascular: Dorsalis Pedis artery and Posterior Tibial artery pedal pulses are 2/4 bilateral with immedate capillary fill time. Pedal hair growth present. No varicosities and no lower extremity edema present bilateral.   Neruologic: Grossly intact via light touch bilateral. Vibratory intact via tuning fork bilateral. Protective threshold with Semmes Wienstein monofilament intact to all pedal sites bilateral. Patellar and Achilles deep tendon reflexes 2+ bilateral. No Babinski or clonus noted bilateral.    Musculoskeletal: No gross boney pedal deformities bilateral. No pain, crepitus, or limitation noted with foot and ankle range of motion bilateral. Muscular strength 5/5 in all groups tested bilateral.  Moderate to severe right heel pain good plantarflexion against resistance.  Heel pain is posterior near the insertion site of the Achilles tendon on the calcaneus.  There is mild erythema mild edema mild fluctuance posteriorly is any tissues appears to be subcutaneous bursitis.  Gait: Unassisted, Nonantalgic.    Radiographs:  Radiographs taken today demonstrate osseously mature individual soft tissue thickening at the Achilles insertion site.  No significant spurring identified.  Plantar spur is identified but the t plantar fascial ligament appears to be normal.   Assessment & Plan:   Assessment: Insertional Achilles tendinitis Achilles bursitis right.  Plan: Discussed etiology pathology conservative surgical therapies at this point were going to reduce her work activities.  Put her in a cam boot to the right foot I injected her bursa today with 2 mg of dexamethasone local anesthetic subcutaneously tolerated procedure well extra Betadine skin prep.  Also started her on methylprednisolone to be followed by meloxicam would like to follow-up with her in 1 month     Evertt Chouinard T. [place], [place]

## 2022-06-15 IMAGING — MG MM DIGITAL DIAGNOSTIC UNILAT*L* W/ TOMO W/ CAD
6 series · 6 of 18 positions shown · non-contrast
Comparison: Previous exam(s).

CLINICAL DATA: Possible asymmetry and adjacent mass in the
posterior left breast in the oblique projection of a recent
screening mammogram.

EXAM:
DIGITAL DIAGNOSTIC UNILATERAL LEFT MAMMOGRAM WITH TOMO AND CAD

[L MLO synth-2D]
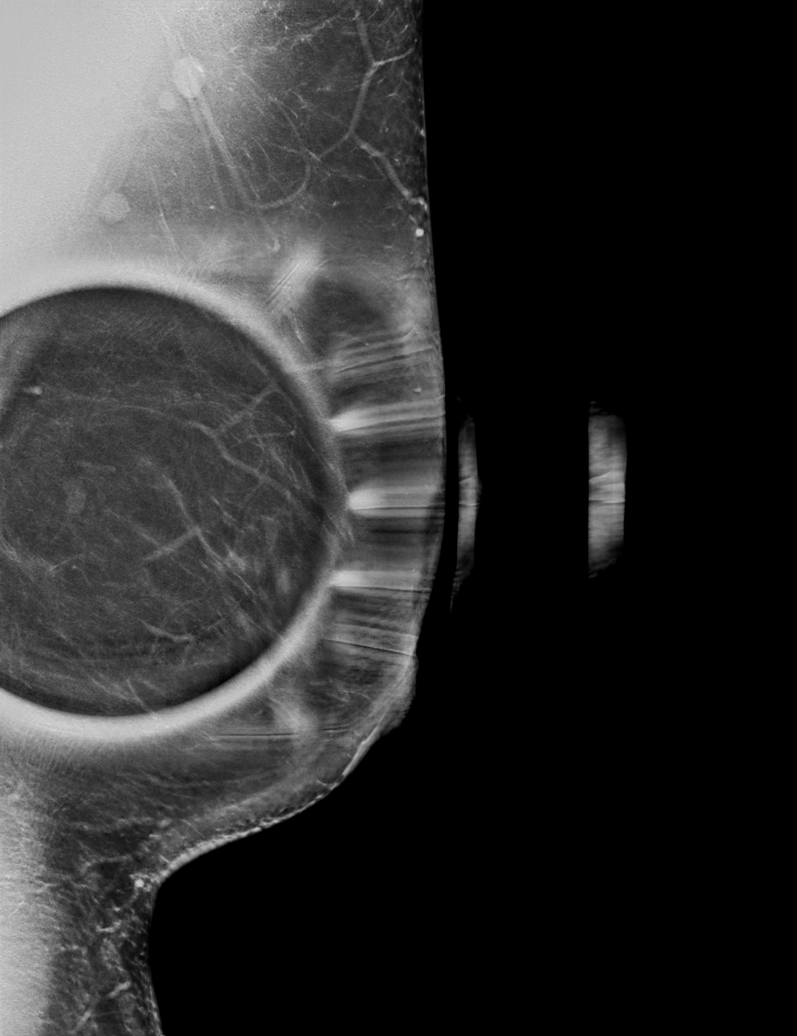

[L CC synth-2D]
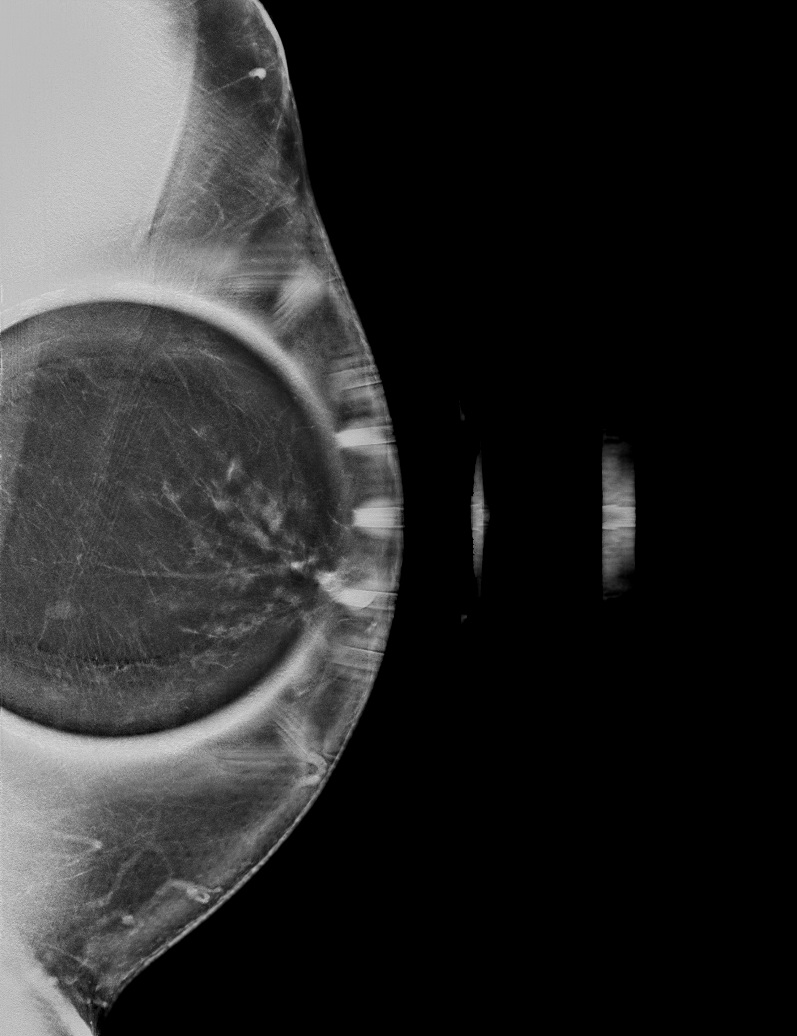

[L ML synth-2D]
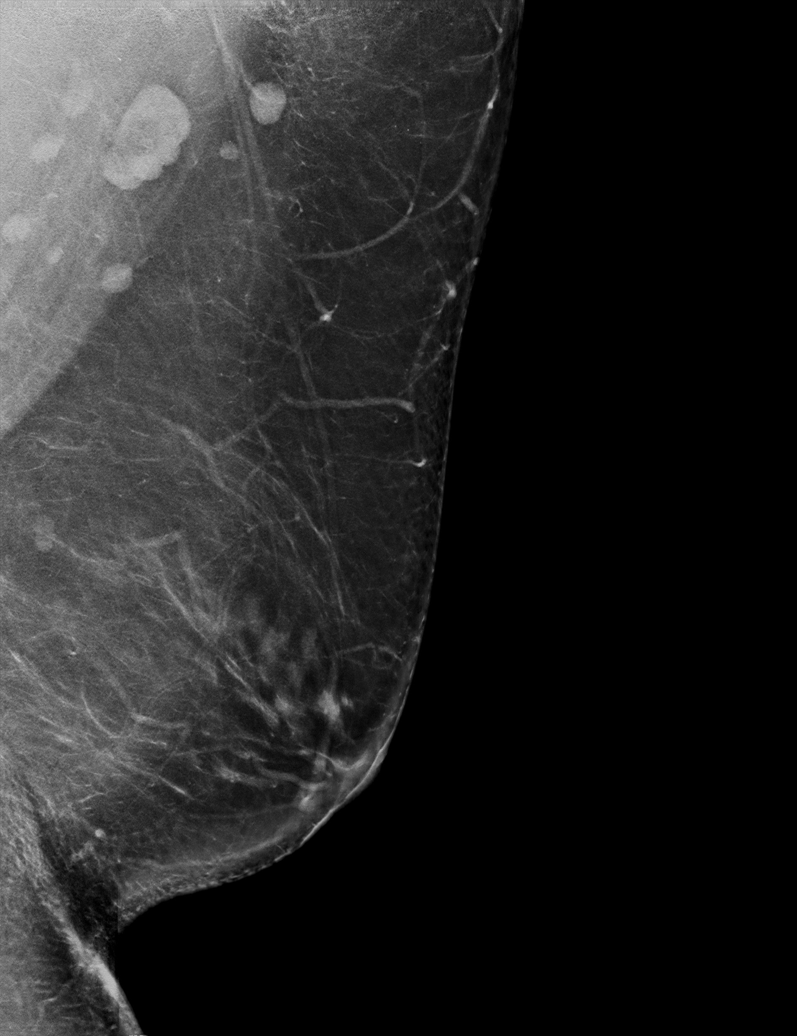

[L CC tomo · tomo slice 51/100.0]
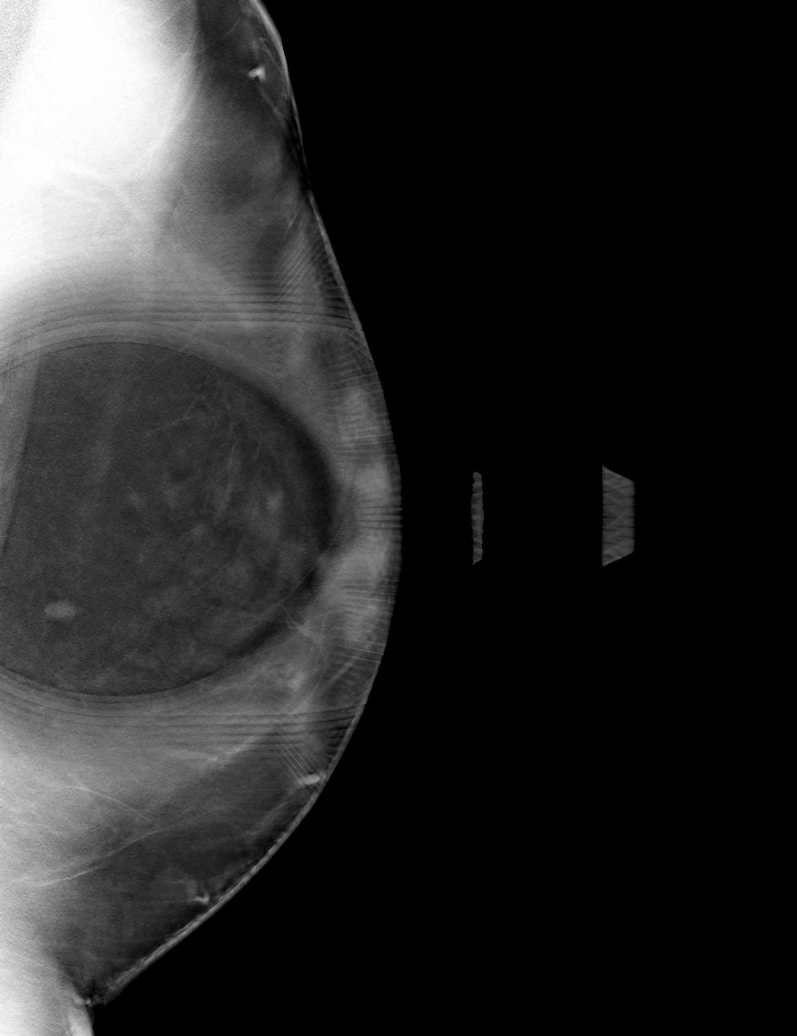

[L ML tomo · tomo slice 45/89.0]
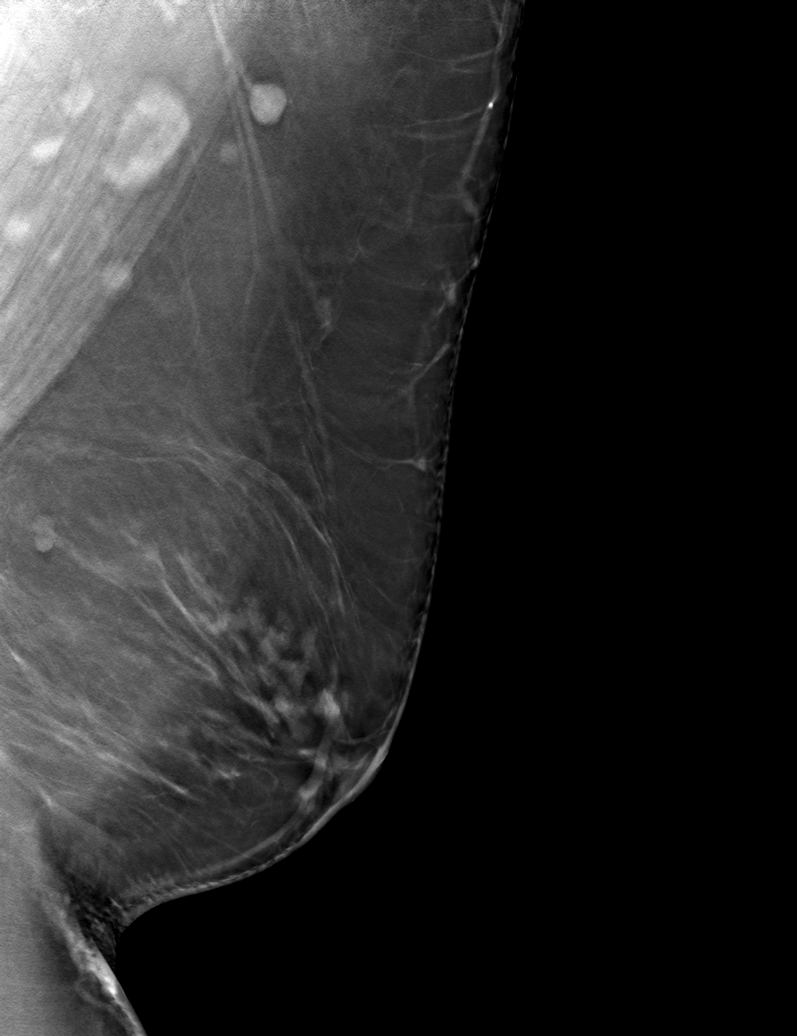

[L MLO tomo · tomo slice 45/90.0]
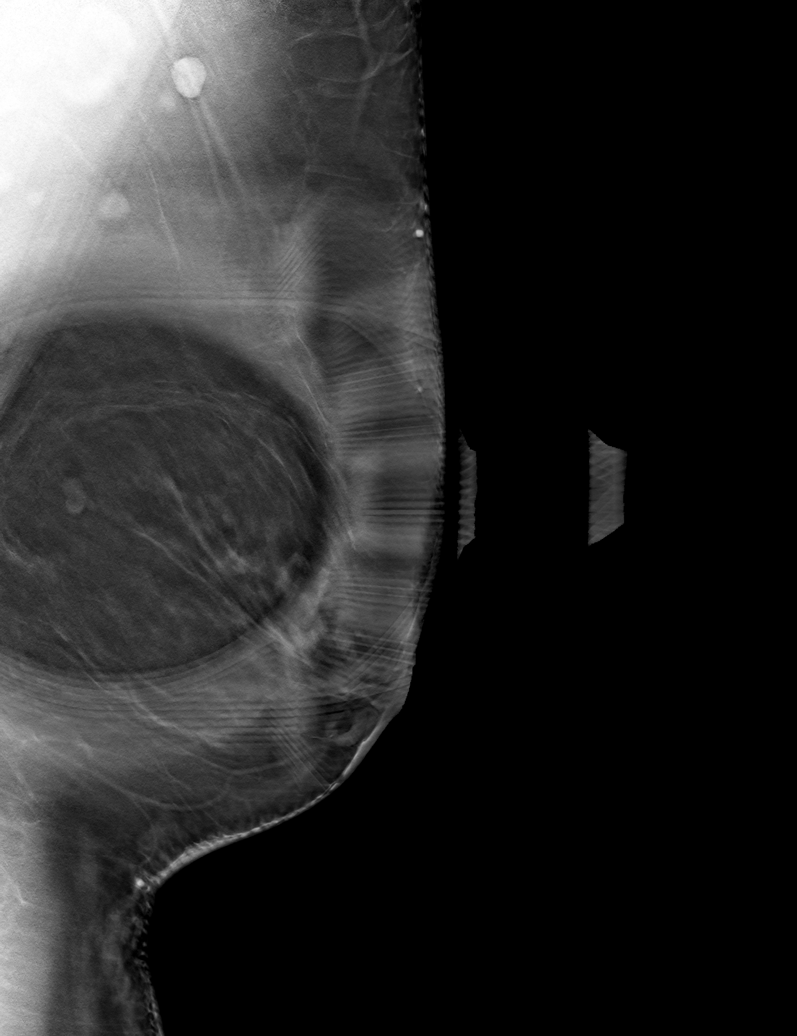

[6 of 18 positions shown; findings below may reference images not displayed]

ACR Breast Density Category b: There are scattered areas of
fibroglandular density.
FINDINGS: 3D tomographic and 2D generated true lateral and spot compression
images of the left breast were obtained. The recently suspected
asymmetry in the posterior left breast has the appearance of normal
fibroglandular tissue on today's images. The adjacent mass is an
oval, circumscribed mass with an eccentric fatty hilum, similar to
lymph nodes seen in the inferior left axilla.

Mammographic images were processed with CAD.
IMPRESSION: 1. No evidence of malignancy.
2. The recently suspected left breast asymmetry is normal
fibroglandular tissue.
3. The recently demonstrated posterior left breast mass is a normal
lymph node.

RECOMMENDATION:
Bilateral screening mammogram in 1 year.

I have discussed the findings and recommendations with the patient.
If applicable, a reminder letter will be sent to the patient
regarding the next appointment.

BI-RADS CATEGORY  2: Benign.

## 2022-07-09 ENCOUNTER — Ambulatory Visit (INDEPENDENT_AMBULATORY_CARE_PROVIDER_SITE_OTHER): Payer: BC Managed Care – PPO | Admitting: Podiatry

## 2022-07-09 ENCOUNTER — Encounter: Payer: Self-pay | Admitting: Podiatry

## 2022-07-09 DIAGNOSIS — M7661 Achilles tendinitis, right leg: Secondary | ICD-10-CM | POA: Diagnosis not present

## 2022-07-09 NOTE — Progress Notes (Signed)
   She presents today for follow-up of her Achilles tendinitis of her right foot.  She states that she is nearly 100% improved continues to take her meloxicam daily and wear her boot at work.  Objective: Vital signs are stable she alert and oriented x 3.  There is no erythema edema salines drainage odor no tenderness on palpation of the right posterior heel.  No interruption of the Achilles margins.  There is no warmth on palpation and I no longer am able to palpate the bursa that was present.  Assessment: Resolving insertional Achilles tendinitis bursitis right.  Plan: This point I recommend she continue to wear the boot for another month and continue to take the meloxicam 1 by mouth daily and I will follow-up with her in about 6 weeks.  She will be out of the boot and into regular shoe gear at work for 2 weeks before I see her.

## 2022-08-20 ENCOUNTER — Encounter: Payer: Self-pay | Admitting: Podiatry

## 2022-08-20 ENCOUNTER — Ambulatory Visit (INDEPENDENT_AMBULATORY_CARE_PROVIDER_SITE_OTHER): Payer: BC Managed Care – PPO | Admitting: Podiatry

## 2022-08-20 DIAGNOSIS — M7661 Achilles tendinitis, right leg: Secondary | ICD-10-CM

## 2022-08-20 NOTE — Progress Notes (Signed)
Jade David presents today for follow-up of her Achilles tendinitis and bursitis of her right heel.  She states that she is doing great absolutely no pain whatsoever she is back into her regular shoes and continues to take her anti-inflammatory on a regular basis.  Objective: There is no erythema edema salines drainage or odor no pain on palpation.  And there is no warmth on palpation.  She has good plantarflexion against resistance with no pain.  Assessment: Well-healing Achilles tendinitis bursitis.  Plan: She will follow-up with me on an as-needed basis she will finish up her anti-inflammatories.
